# Patient Record
Sex: Male | Born: 1970 | Race: White | Hispanic: Yes | Marital: Married | Smoking: Never smoker
Health system: Southern US, Community
[De-identification: ages and names within clinical notes are randomized; demographics above are authoritative.]

---

## 2016-10-29 ENCOUNTER — Ambulatory Visit (INDEPENDENT_AMBULATORY_CARE_PROVIDER_SITE_OTHER): Payer: Medicaid Other | Admitting: Physician Assistant

## 2016-10-29 ENCOUNTER — Encounter (INDEPENDENT_AMBULATORY_CARE_PROVIDER_SITE_OTHER): Payer: Self-pay | Admitting: Physician Assistant

## 2016-10-29 VITALS — BP 123/77 | HR 75 | Temp 97.9°F | Ht 65.75 in | Wt 196.6 lb

## 2016-10-29 DIAGNOSIS — Z0000 Encounter for general adult medical examination without abnormal findings: Secondary | ICD-10-CM

## 2016-10-29 NOTE — Progress Notes (Signed)
  Subjective:  Patient ID: Eric Webb, male    DOB: February 24, 1971  Age: 46 y.o. MRN: [id number]  CC: Annual physical  HPI Eric Webb is a 46 y.o. male with no significant PMH presents for a full physical exam. States that he has been generally well. Only complaint isoccasional  right shoulder pain attributed to working out. There is a popping and grinding sensation upon abduction and flexion of the shoulder. Popping and grinding in itself is not painful but heavy lifting above the shoulder level is. Has not received previous treatment nor is he interested in treatment at this time due to how mild the pain is. No tingling/numbness, weakness, limited aROM, or vertebral pain. Denies any other symptoms.  Review of Systems  Constitutional: Negative for chills, fever and malaise/fatigue.  Eyes: Negative for blurred vision.  Respiratory: Negative for shortness of breath.   Cardiovascular: Negative for chest pain and palpitations.  Gastrointestinal: Negative for abdominal pain and nausea.  Genitourinary: Negative for dysuria and hematuria.  Musculoskeletal: Positive for joint pain. Negative for myalgias.  Skin: Negative for rash.  Neurological: Negative for tingling and headaches.  Psychiatric/Behavioral: Negative for depression. The patient is not nervous/anxious.     Objective:  BP 123/77   Pulse 75   Temp 97.9 F (36.6 C)   Ht 5' 5.75" (1.67 m)   Wt 196 lb 9.6 oz (89.2 kg)   SpO2 99%   BMI 31.98 kg/m   BP/Weight 10/29/2016  Systolic BP 123  Diastolic BP 77  Wt. (Lbs) 196.6  BMI 31.98      Physical Exam  Constitutional: He is oriented to person, place, and time.  Well developed,overweight, NAD, polite  HENT:  Head: Normocephalic and atraumatic.  Mouth/Throat: No oropharyngeal exudate.  Eyes: Conjunctivae and EOM are normal. Pupils are equal, round, and reactive to light. No scleral icterus.  Neck: Normal range of motion. Neck supple. No thyromegaly present.   Cardiovascular: Normal rate, regular rhythm and normal heart sounds.   Pulmonary/Chest: Effort normal and breath sounds normal.  Abdominal: Soft. Bowel sounds are normal. He exhibits no distension and no mass. There is no tenderness. There is no rebound and no guarding.  Genitourinary: Penis normal.  Genitourinary Comments: Testicles normal, prostate 25g, central fissure normal, no nodules, non tender.  Musculoskeletal: He exhibits no edema, tenderness or deformity.  Lymphadenopathy:    He has no cervical adenopathy.  Neurological: He is alert and oriented to person, place, and time. He has normal reflexes. No cranial nerve deficit. Coordination normal.  Skin: Skin is warm and dry. No rash noted. No erythema. No pallor.  Psychiatric: He has a normal mood and affect. His behavior is normal. Thought content normal.  Vitals reviewed.    Assessment & Plan:   1. Annual physical exam - CBC with Differential/Platelet - Comprehensive metabolic panel - PSA - Lipid panel     Follow-up: Return in about 1 year (around 10/29/2017).   Loletta Specter PA

## 2016-10-29 NOTE — Patient Instructions (Signed)
Anlisis de antgeno prosttico especfico (Prostate-Specific Antigen Test) POR QU ME DEBO REALIZAR ESTE ANLISIS? El anlisis de antgeno prosttico especfico (PSA) se realiza para Production assistant, radio cantidad de PSA que tiene Risk manager. El PSA es un tipo de protena que generalmente est presente en la prstata. Ciertas enfermedades pueden hacer que los niveles sanguneos de PSA aumenten, tales como:  Infeccin de la prstata (prostatitis).  Agrandamiento de la prstata (hipertrofia).  Cncer de prstata. Dado que los niveles de PSA aumentan mucho a causa del cncer de prstata, este anlisis se puede usar para confirmar un diagnstico de Firefighter. Tambin se puede usar para Sales executive tratamiento para Management consultant de prstata y para ver si el cncer de prstata vuelve a aparecer despus de que el tratamiento ha finalizado. Este [employer] tiene un ndice muy alto de [place] positivos falsos. Por lo tanto, ya no se recomienda la prueba de deteccin de rutina del PSA para todos los hombres. Este tipo de resultado es incorrecto porque indica que una enfermedad o un hallazgo est presente cuando en realidad no lo est. QU TIPO DE MUESTRA SE TOMA? Para esta prueba, se extrae Lauris Poag de [place]. Por lo general, para extraerla, se introduce una aguja en una vena o se pincha un dedo con una aguja pequea. CMO DEBO PREPARARME PARA ESTE ANLISIS? No se requiere preparacin para este anlisis. Sin embargo, Therapist, music otros factores que pueden afectar los resultados del [place] de PSA. Para obtener los resultados ms precisos:  Higher education careers adviser un examen rectal en el perodo de varias horas previas a la extraccin de sangre para este anlisis.  Evite cualquier procedimiento en la prstata en el perodo de las 6 semanas previas a este anlisis.  Evite eyacular dentro de las 24horas previas a este anlisis.  Infrmele al mdico si tuvo una infeccin urinaria reciente.  Infrmele al mdico si est  tomando medicamentos para ayudar a que le crezca el pelo, como finasterida.  Infrmele al mdico si ha estado expuesto a un medicamento llamado dietilestilbestrol. Infrmele al mdico si cualquiera de estos factores corresponde a su caso. Es posible que se le pida que reprograme el [place]. QU SON LOS VALORES DE REFERENCIA? Los valores de referencia se establecen despus de realizarle el anlisis a un grupo grande de Dealer. Pueden variar [employer], laboratorios y hospitales. Es su responsabilidad retirar el resultado del [place]. Consulte en el laboratorio o en el departamento en el que fue realizado el estudio cundo y cmo podr [employer].  Bajo: de 0 a 2,5ng/ml.  Levemente a moderadamente elevado: de 2,6 a 10,0ng/ml.  Moderadamente elevado: de 10,0 a 19,9ng/ml.  Muy elevado: 20ng/ml o ms. QU SIGNIFICAN LOS RESULTADOS? En la [employer] de los hombres con cncer de prstata, los resultados del [place] de PSA son mayores de 4ng/ml. Si el resultado del anlisis est por encima de [address], esto puede indicar un aumento del riesgo de cncer de prstata. Un aumento en los niveles de PSA tambin puede indicar otras enfermedades. Hable con el mdico [employer], las opciones de tratamiento y, si es necesario, la necesidad de Education officer, environmental ms [place]. Hable con el mdico si tiene [employer]. Esta informacin no tiene Theme park manager el consejo del mdico. Asegrese de hacerle al mdico cualquier pregunta que tenga. Document Released: 04/19/2013 Document Revised: 07/20/2014 Document Reviewed: 11/22/2013 Elsevier Interactive Patient Education  2017 [employer].

## 2016-11-09 ENCOUNTER — Other Ambulatory Visit (INDEPENDENT_AMBULATORY_CARE_PROVIDER_SITE_OTHER): Payer: Medicaid Other

## 2016-11-09 ENCOUNTER — Encounter (INDEPENDENT_AMBULATORY_CARE_PROVIDER_SITE_OTHER): Payer: Self-pay

## 2016-11-09 ENCOUNTER — Other Ambulatory Visit (INDEPENDENT_AMBULATORY_CARE_PROVIDER_SITE_OTHER): Payer: Self-pay | Admitting: Physician Assistant

## 2016-11-09 VITALS — BP 143/85 | HR 59 | Temp 98.1°F | Wt 197.2 lb

## 2016-11-09 DIAGNOSIS — Z114 Encounter for screening for human immunodeficiency virus [HIV]: Secondary | ICD-10-CM | POA: Diagnosis not present

## 2016-11-09 DIAGNOSIS — Z0000 Encounter for general adult medical examination without abnormal findings: Secondary | ICD-10-CM

## 2016-11-09 NOTE — Progress Notes (Signed)
Needs new orders for his annual physical labs.

## 2016-11-10 ENCOUNTER — Other Ambulatory Visit (INDEPENDENT_AMBULATORY_CARE_PROVIDER_SITE_OTHER): Payer: Self-pay | Admitting: Physician Assistant

## 2016-11-10 DIAGNOSIS — E785 Hyperlipidemia, unspecified: Secondary | ICD-10-CM

## 2016-11-10 DIAGNOSIS — R748 Abnormal levels of other serum enzymes: Secondary | ICD-10-CM

## 2016-11-10 LAB — CBC WITH DIFFERENTIAL/PLATELET
BASOS ABS: 0 10*3/uL (ref 0.0–0.2)
Basos: 0 %
EOS (ABSOLUTE): 0.2 10*3/uL (ref 0.0–0.4)
Eos: 2 %
Hematocrit: 42.1 % (ref 37.5–51.0)
Hemoglobin: 14.3 g/dL (ref 13.0–17.7)
IMMATURE GRANS (ABS): 0 10*3/uL (ref 0.0–0.1)
Immature Granulocytes: 0 %
LYMPHS: 45 %
Lymphocytes Absolute: 3.5 10*3/uL — ABNORMAL HIGH (ref 0.7–3.1)
MCH: 28.9 pg (ref 26.6–33.0)
MCHC: 34 g/dL (ref 31.5–35.7)
MCV: 85 fL (ref 79–97)
Monocytes Absolute: 0.5 10*3/uL (ref 0.1–0.9)
Monocytes: 6 %
NEUTROS PCT: 47 %
Neutrophils Absolute: 3.7 10*3/uL (ref 1.4–7.0)
PLATELETS: 198 10*3/uL (ref 150–379)
RBC: 4.94 x10E6/uL (ref 4.14–5.80)
RDW: 13.6 % (ref 12.3–15.4)
WBC: 7.9 10*3/uL (ref 3.4–10.8)

## 2016-11-10 LAB — COMPREHENSIVE METABOLIC PANEL
ALT: 77 IU/L — AB (ref 0–44)
AST: 36 IU/L (ref 0–40)
Albumin/Globulin Ratio: 1.7 (ref 1.2–2.2)
Albumin: 4.5 g/dL (ref 3.5–5.5)
Alkaline Phosphatase: 56 IU/L (ref 39–117)
BILIRUBIN TOTAL: 0.4 mg/dL (ref 0.0–1.2)
BUN/Creatinine Ratio: 15 (ref 9–20)
BUN: 14 mg/dL (ref 6–24)
CALCIUM: 9.5 mg/dL (ref 8.7–10.2)
CHLORIDE: 101 mmol/L (ref 96–106)
CO2: 23 mmol/L (ref 18–29)
Creatinine, Ser: 0.92 mg/dL (ref 0.76–1.27)
GFR, EST AFRICAN AMERICAN: 115 mL/min/{1.73_m2} (ref >59)
GFR, EST NON AFRICAN AMERICAN: 99 mL/min/{1.73_m2} (ref >59)
GLUCOSE: 101 mg/dL — AB (ref 65–99)
Globulin, Total: 2.7 g/dL (ref 1.5–4.5)
Potassium: 4.7 mmol/L (ref 3.5–5.2)
Sodium: 139 mmol/L (ref 134–144)
TOTAL PROTEIN: 7.2 g/dL (ref 6.0–8.5)

## 2016-11-10 LAB — LIPID PANEL
Chol/HDL Ratio: 5.3 ratio — ABNORMAL HIGH (ref 0.0–5.0)
Cholesterol, Total: 179 mg/dL (ref 100–199)
HDL: 34 mg/dL — AB (ref >39)
LDL Calculated: 112 mg/dL — ABNORMAL HIGH (ref 0–99)
Triglycerides: 164 mg/dL — ABNORMAL HIGH (ref 0–149)
VLDL CHOLESTEROL CAL: 33 mg/dL (ref 5–40)

## 2016-11-10 LAB — PSA: Prostate Specific Ag, Serum: 1.3 ng/mL (ref 0.0–4.0)

## 2016-11-10 LAB — HIV ANTIBODY (ROUTINE TESTING W REFLEX): HIV Screen 4th Generation wRfx: NONREACTIVE

## 2016-11-10 MED ORDER — SIMVASTATIN 20 MG PO TABS: 20.0000 mg | ORAL_TABLET | Freq: Every day | ORAL | 1 refills | Status: DC

## 2016-11-10 NOTE — Progress Notes (Signed)
Elevated liver enzymes

## 2017-01-22 ENCOUNTER — Other Ambulatory Visit (INDEPENDENT_AMBULATORY_CARE_PROVIDER_SITE_OTHER): Payer: Self-pay | Admitting: Physician Assistant

## 2017-01-22 DIAGNOSIS — G8929 Other chronic pain: Secondary | ICD-10-CM

## 2017-01-22 DIAGNOSIS — M25511 Pain in right shoulder: Principal | ICD-10-CM

## 2017-01-26 ENCOUNTER — Ambulatory Visit (HOSPITAL_COMMUNITY)
Admission: RE | Admit: 2017-01-26 | Discharge: 2017-01-26 | Disposition: A | Discharge status: Home / Self Care | Payer: Medicaid Other | Source: Ambulatory Visit | Attending: Physician Assistant | Admitting: Physician Assistant

## 2017-01-26 ENCOUNTER — Other Ambulatory Visit (INDEPENDENT_AMBULATORY_CARE_PROVIDER_SITE_OTHER): Payer: Self-pay | Admitting: Physician Assistant

## 2017-01-26 DIAGNOSIS — M25511 Pain in right shoulder: Secondary | ICD-10-CM | POA: Diagnosis present

## 2017-01-26 DIAGNOSIS — E785 Hyperlipidemia, unspecified: Secondary | ICD-10-CM

## 2017-01-26 DIAGNOSIS — R748 Abnormal levels of other serum enzymes: Secondary | ICD-10-CM | POA: Diagnosis not present

## 2017-01-26 DIAGNOSIS — G8929 Other chronic pain: Secondary | ICD-10-CM | POA: Insufficient documentation

## 2017-01-26 MED ORDER — SIMVASTATIN 20 MG PO TABS: 20.0000 mg | ORAL_TABLET | Freq: Every day | ORAL | 1 refills | Status: DC

## 2017-01-27 ENCOUNTER — Ambulatory Visit (INDEPENDENT_AMBULATORY_CARE_PROVIDER_SITE_OTHER): Payer: Medicaid Other | Admitting: Physician Assistant

## 2017-01-27 ENCOUNTER — Encounter (INDEPENDENT_AMBULATORY_CARE_PROVIDER_SITE_OTHER): Payer: Self-pay | Admitting: Physician Assistant

## 2017-01-27 VITALS — BP 127/82 | HR 86 | Temp 97.9°F | Wt 195.0 lb

## 2017-01-27 DIAGNOSIS — G8929 Other chronic pain: Secondary | ICD-10-CM

## 2017-01-27 DIAGNOSIS — M25511 Pain in right shoulder: Secondary | ICD-10-CM

## 2017-01-27 DIAGNOSIS — S4991XS Unspecified injury of right shoulder and upper arm, sequela: Secondary | ICD-10-CM

## 2017-01-27 MED ORDER — ACETAMINOPHEN-CODEINE #3 300-30 MG PO TABS: 1.0000 | ORAL_TABLET | ORAL | 0 refills | Status: AC | PRN

## 2017-01-27 MED ORDER — NAPROXEN 500 MG PO TBEC: 500.0000 mg | DELAYED_RELEASE_TABLET | Freq: Two times a day (BID) | ORAL | 1 refills | Status: DC

## 2017-01-27 NOTE — Patient Instructions (Signed)
Lesiones de Hydrographic surveyorla parte superior del labrum glenoideo con rehabilitacin (SLAP Lesions With Rehab) Las lesiones del labrum glenoideo (Superior Labrum Anterior to Posterior, SLAP) son lesiones en una parte del tejido conjuntivo (TEFL teachercartlago) de la articulacin del hombro. El extremo de Seychellesarriba del hueso de la parte superior del brazo (hmero) encaja en una cavidad del omplato y forma la articulacin del hombro. Alrededor del borde de esa cavidad, hay un borde firme de cartlago (labrum). El labrum le otorga profundidad a la cavidad y sostiene al hmero en su Environmental consultantlugar. Si determinada parte del labrum se desgarra o se desgasta, se produce una lesin conocida como SLAP. La lesin SLAP puede causar dolor, inestabilidad y debilidad en el hombro. Las lesiones SLAP son frecuentes en los atletas que practican deportes en los que deben realizar movimientos repetidos por encima del nivel del hombro. Las lesiones SLAP pueden incluir un desgarro en el cordn de tejido que une el msculo de adelante de la parte superior del brazo con el omplato (tendn proximal del bceps). CAUSAS Esta afeccin puede ser causada por lo siguiente:  Neomia DearUna lesin repentina (aguda), que puede deberse a lo siguiente: ? Una cada sobre el brazo extendido. ? Un movimiento de la articulacin del hombro que lo saca de su ubicacin normal (luxacin). ? Un golpe directo en el hombro.  Desgaste con el paso del tiempo, que puede deberse a la prctica de deportes o actividades en los que se realizan movimientos del brazo por encima del nivel del hombro. FACTORES DE RIESGO Los siguientes factores pueden hacer que usted sea ms propenso a sufrir una lesin SLAP:  Haberse luxado un hombro en el pasado.  Ser mayor de [id number].  Practicar determinados deportes, por ejemplo: ? Deportes que requieren Education officer, environmentalrealizar de forma repetida movimientos del brazo por encima del nivel del hombro, como en el bisbol o el vleibol. ? Deportes en los que se ejerce una  presin posterior en los brazos al tenerlos levantados por encima del hombro, como gimnasia o bsquetbol. ? Deportes de contacto.  Levantamiento de pesas. SNTOMAS El sntoma principal de esta afeccin es Chief Technology Officerel dolor en el hombro que empeora al levantar un objeto pesado o al levantar el brazo por encima del nivel hombro. Otros signos y sntomas pueden incluir los siguientes:  Sensacin de que el hombro est trabado o bloqueado, o que hace un chasquido o chirrido.  Prdida de la fuerza.  Rigidez y amplitud de movimientos limitada.  Prdida de la fuerza de lanzamiento ("brazo muerto"). DIAGNSTICO Esta afeccin se puede diagnosticar en funcin de lo siguiente:  Sus sntomas.  Sus antecedentes mdicos.  Un examen fsico.  Pruebas de diagnstico por imgenes, como resonancias magnticas (RM). TRATAMIENTO El tratamiento de esta afeccin puede incluir lo siguiente:  Radio producerMantener el hombro en reposo, evitando las actividades que causen dolor en el hombro.  Tomar antiinflamatorios no esteroides Murriel Hopper(AINE) para ayudar a Glass blower/designerreducir el dolor y la hinchazn.  Hacer fisioterapia para mejorar la fuerza y la amplitud de movimientos.  Someterse a [place]una ciruga. Puede realizarse si otros tratamientos no son eficaces. La Kandis Banciruga consiste en lo siguiente: ? Extraer las partes desgastadas del labrum. ? Reparar los desgarros. ? Volver a unir el labrum. ? Reparar el tendn del bceps. INSTRUCCIONES PARA EL CUIDADO EN EL HOGAR Control del dolor, la rigidez y la hinchazn  Si se lo indican, aplique hielo sobre la zona de la lesin. ? Ponga el hielo en una bolsa plstica. ? Coloque una FirstEnergy Corptoalla entre la piel y la bolsa de  hielo. ? Coloque el hielo durante , 2 a 3veces por da. Conducir  No conduzca ni opere maquinaria pesada mientras toma analgsicos recetados.  Pregntele al mdico cundo es seguro volver a Science writer. Actividad  Retome sus actividades normales como se lo haya indicado el mdico.  Pregntele al mdico qu actividades son seguras para usted.  Haga ejercicios como se lo haya indicado el mdico. Instrucciones generales  No consuma ningn producto que contenga tabaco, lo que incluye cigarrillos, tabaco de Theatre manager y Administrator, Civil Service. El tabaco puede retrasar el proceso de curacin. Si necesita ayuda para dejar de fumar, consulte al [employer].  [employer] de venta libre y los recetados solamente como se lo haya indicado el mdico.  Oceanographer a todas las visitas de control como se lo haya indicado el mdico. Esto es importante. PREVENCIN  Tome medidas de seguridad y sea responsable al hacer [place], para evitar las cadas.  Mantenga un buen estado fsico, lo que incluye la flexibilidad y Primary school teacher. SOLICITE ATENCIN MDICA SI:  Los sntomas no mejoran despus de de [place].  Los sntomas empeoran en vez de Scientist, clinical (histocompatibility and immunogenetics). Esta informacin no tiene Theme park manager el consejo del mdico. Asegrese de hacerle al mdico cualquier pregunta que tenga. Document Released: 04/15/2006 Document Revised: 11/13/2014 Document Reviewed: 06/01/2015 Elsevier Interactive Patient Education  [employer].

## 2017-01-27 NOTE — Progress Notes (Signed)
Subjective:  Patient ID: Eric Webb, male    DOB: [birth year]  Age: 46 y.o. MRN: [id number]  CC: shoulder  HPI Eric OsgoodJavier Webb is a 46 y.o. male with a PMH of fatty liver and HLD presents with right shoulder pain since 2 years ago. Pain is felt at the glenohumeral joint and is associated with "cracking and popping". Pain is present only with flexion and abduction past 90 degrees. Also states there is mild pain when laying on right side at night. Otherwise, he is able to lift moderately heavy items below shoulder level. Has full ROM. Does not endorse tingling, numbness, weakness, paralysis, edema, erythema, ecchymosis, deformity, neck pain, back pain, or history of trauma.     Outpatient Medications Prior to Visit  Medication Sig Dispense Refill  . simvastatin (ZOCOR) 20 MG tablet Take 1 tablet (20 mg total) by mouth at bedtime. 90 tablet 1   No facility-administered medications prior to visit.      ROS Review of Systems  Constitutional: Negative for chills, fever and malaise/fatigue.  Eyes: Negative for blurred vision.  Respiratory: Negative for shortness of breath.   Cardiovascular: Negative for chest pain and palpitations.  Gastrointestinal: Negative for abdominal pain and nausea.  Genitourinary: Negative for dysuria and hematuria.  Musculoskeletal: Positive for joint pain. Negative for myalgias.  Skin: Negative for rash.  Neurological: Negative for tingling and headaches.  Psychiatric/Behavioral: Negative for depression. The patient is not nervous/anxious.     Objective:  BP 127/82 (BP Location: Left Arm, Patient Position: Sitting, Cuff Size: Normal)   Pulse 86   Temp 97.9 F (36.6 C) (Oral)   Wt 195 lb (88.5 kg)   SpO2 99%   BMI 31.72 kg/m   BP/Weight 01/27/2017 11/09/2016 10/29/2016  Systolic BP 127 143 123  Diastolic BP 82 85 77  Wt. (Lbs) 195 197.2 196.6  BMI 31.72 32.07 31.98      Physical Exam  Constitutional: He is oriented to person, place, and time.   Well developed, overweight, NAD, polite  HENT:  Head: Normocephalic and atraumatic.  Eyes: No scleral icterus.  Neck: Normal range of motion.  Musculoskeletal:  Crank test and Apprehension test positive. Negative Napolean's, Hornblowers, Cross body, Neer's, Hawkin's, AC shear, and internal rotation/external rotation resistance testing. Crepitus of the right shoulder. Full aROM and pROM of right shoulder.  Neurological: He is alert and oriented to person, place, and time. No cranial nerve deficit. Coordination normal.  Right hand grip strength 5/5  Skin: Skin is warm and dry.  Psychiatric: He has a normal mood and affect. His behavior is normal. Thought content normal.  Vitals reviewed.    Assessment & Plan:   1. Chronic right shoulder pain - Begin acetaminophen-codeine (TYLENOL #3) 300-30 MG tablet; Take 1 tablet by mouth every 4 (four) hours as needed for moderate pain.  Dispense: 42 tablet; Refill: 0 - Begin naproxen (EC NAPROSYN) 500 MG EC tablet; Take 1 tablet (500 mg total) by mouth 2 (two) times daily with a meal.  Dispense: 60 tablet; Refill: 1 - MR Shoulder Right Wo Contrast; Future  2. Injury of glenoid labrum, right, sequela - Begin acetaminophen-codeine (TYLENOL #3) 300-30 MG tablet; Take 1 tablet by mouth every 4 (four) hours as needed for moderate pain.  Dispense: 42 tablet; Refill: 0 - Begin naproxen (EC NAPROSYN) 500 MG EC tablet; Take 1 tablet (500 mg total) by mouth 2 (two) times daily with a meal.  Dispense: 60 tablet; Refill: 1 - MR Shoulder Right Wo Contrast; Future  Meds ordered this encounter  Medications  . acetaminophen-codeine (TYLENOL #3) 300-30 MG tablet    Sig: Take 1 tablet by mouth every 4 (four) hours as needed for moderate pain.    Dispense:  42 tablet    Refill:  0    Order Specific Question:   Supervising Provider    Answer:   Quentin Angst [id number]  . naproxen (EC NAPROSYN) 500 MG EC tablet    Sig: Take 1 tablet (500 mg total) by  mouth 2 (two) times daily with a meal.    Dispense:  60 tablet    Refill:  1    Order Specific Question:   Supervising Provider    Answer:   Quentin Angst [id number]    Follow-up: Return in about 4 weeks (around 02/24/2017).   Loletta Specter PA

## 2020-03-28 ENCOUNTER — Ambulatory Visit (INDEPENDENT_AMBULATORY_CARE_PROVIDER_SITE_OTHER): Payer: BC Managed Care – PPO

## 2020-03-28 ENCOUNTER — Ambulatory Visit
Admission: EM | Admit: 2020-03-28 | Discharge: 2020-03-28 | Disposition: A | Discharge status: Home / Self Care | Payer: BC Managed Care – PPO | Attending: Emergency Medicine | Admitting: Emergency Medicine

## 2020-03-28 DIAGNOSIS — U071 COVID-19: Secondary | ICD-10-CM | POA: Diagnosis not present

## 2020-03-28 MED ORDER — ACETAMINOPHEN 325 MG PO TABS
975.0000 mg | ORAL_TABLET | Freq: Once | ORAL | Status: AC
  Administered 2020-03-28: 16:00:00 975 mg via ORAL

## 2020-03-28 MED ORDER — BENZONATATE 100 MG PO CAPS: 100.0000 mg | ORAL_CAPSULE | Freq: Three times a day (TID) | ORAL | 0 refills | Status: AC

## 2020-03-28 MED ORDER — AZITHROMYCIN 250 MG PO TABS: 250.0000 mg | ORAL_TABLET | Freq: Every day | ORAL | 0 refills | Status: AC

## 2020-03-28 NOTE — ED Provider Notes (Signed)
[facility]    CSN: [id number] Arrival date & time: 03/28/20  1519      History   Chief Complaint Chief Complaint  Patient presents with  . covid + - fever    HPI Eric Webb is a 49 y.o. male  Presenting for chest x-ray.  States he is known Covid positive: Underwent testing 9/10 due to exposure.  Developed symptoms last Friday.  Primarily concerned about cough which is nonproductive, not associated with chest pain, palpitations, shortness of breath, lower leg swelling, or lightheadedness.  Does have fevers that are intermittent, alleviated with Tylenol.  Has had intermittent abdominal pain without exacerbating or alleviating factors.  Denies vomiting or diarrhea.  Taken OTC medications with moderate relief.  History reviewed. No pertinent past medical history.  There are no problems to display for this patient.   History reviewed. No pertinent surgical history.     Home Medications    Prior to Admission medications   Medication Sig Start Date End Date Taking? Authorizing Provider  azithromycin (ZITHROMAX) 250 MG tablet Take 1 tablet (250 mg total) by mouth daily. Take first 2 tablets together, then 1 every day until finished. 03/28/20   Hall-Potvin, [place], PA-C  benzonatate (TESSALON) 100 MG capsule Take 1 capsule (100 mg total) by mouth every 8 (eight) hours. 03/28/20   Hall-Potvin, [place], PA-C    Family History History reviewed. No pertinent family history.  Social History Social History   Tobacco Use  . Smoking status: Never Smoker  . Smokeless tobacco: Never Used  Substance Use Topics  . Alcohol use: Not Currently  . Drug use: Not Currently     Allergies   Patient has no known allergies.   Review of Systems As per HPI   Physical Exam Triage Vital Signs ED Triage Vitals  Enc Vitals Group     BP      Pulse      Resp      Temp      Temp src      SpO2      Weight      Height      Head Circumference      Peak Flow       Pain Score      Pain Loc      Pain Edu?      Excl. in GC?    No data found.  Updated Vital Signs BP 133/75 (BP Location: Left Arm)   Pulse 90   Temp (!) 102.6 F (39.2 C) (Oral)   Resp 18   SpO2 96%   Visual Acuity Right Eye Distance:   Left Eye Distance:   Bilateral Distance:    Right Eye Near:   Left Eye Near:    Bilateral Near:     Physical Exam Constitutional:      General: He is not in acute distress.    Appearance: He is not toxic-appearing or diaphoretic.  HENT:     Head: Normocephalic and atraumatic.     Mouth/Throat:     Mouth: Mucous membranes are moist.     Pharynx: Oropharynx is clear.  Eyes:     General: No scleral icterus.    Conjunctiva/sclera: Conjunctivae normal.     Pupils: Pupils are equal, round, and reactive to light.  Neck:     Comments: Trachea midline, negative JVD Cardiovascular:     Rate and Rhythm: Normal rate and regular rhythm.  Pulmonary:     Effort: Pulmonary effort is normal.  No respiratory distress.     Breath sounds: No wheezing or rhonchi.  Musculoskeletal:     Cervical back: Neck supple. No tenderness.  Lymphadenopathy:     Cervical: No cervical adenopathy.  Skin:    Capillary Refill: Capillary refill takes less than 2 seconds.     Coloration: Skin is not jaundiced or pale.     Findings: No rash.  Neurological:     Mental Status: He is alert and oriented to person, place, and time.      UC Treatments / Results  Labs (all labs ordered are listed, but only abnormal results are displayed) Labs Reviewed - No data to display  EKG   Radiology DG Chest 2 View  Result Date: 03/28/2020 CLINICAL DATA:  COVID positive EXAM: CHEST - 2 VIEW COMPARISON:  None. FINDINGS: The heart size and mediastinal contours are within normal limits. Mildly hazy airspace opacity seen within the left mid lung. The right lung is clear. No pleural effusion is seen. No acute osseous abnormality. IMPRESSION: Mildly hazy airspace opacity seen  within right mid lung, which could be due to atelectasis and/or infectious etiology. A Electronically Signed   By: Jonna Clark M.D.   On: 03/28/2020 16:15    Procedures Procedures (including critical care time)  Medications Ordered in UC Medications  acetaminophen (TYLENOL) tablet 975 mg (975 mg Oral Given 03/28/20 1619)    Initial Impression / Assessment and Plan / UC Course  I have reviewed the triage vital signs and the nursing notes.  Pertinent labs & imaging results that were available during my care of the patient were reviewed by me and considered in my medical decision making (see chart for details).     Febrile, nontoxic, and hemodynamically stable with good oxygen saturations.  No shortness of breath, chest pain.  Chest x-ray with mildly hazy airspace opacity seen in right midlung: Could be due to atelectasis and/or infectious etiology.  Given weeklong course of cough with known Covid infection, persistent fevers, will start Z-Pak..  Return precautions discussed, pt verbalized understanding and is agreeable to plan. Final Clinical Impressions(s) / UC Diagnoses   Final diagnoses:  COVID-19 virus infection     Discharge Instructions     Tessalon for cough. Start flonase, atrovent nasal spray for nasal congestion/drainage. You can use over the counter nasal saline rinse such as neti pot for nasal congestion. Keep hydrated, your urine should be clear to pale yellow in color. Tylenol/motrin for fever and pain. Monitor for any worsening of symptoms, chest pain, shortness of breath, wheezing, swelling of the throat, go to the emergency department for further evaluation needed.     ED Prescriptions    Medication Sig Dispense Auth. Provider   benzonatate (TESSALON) 100 MG capsule Take 1 capsule (100 mg total) by mouth every 8 (eight) hours. 21 capsule Hall-Potvin, [place], PA-C   azithromycin (ZITHROMAX) 250 MG tablet Take 1 tablet (250 mg total) by mouth daily. Take first 2  tablets together, then 1 every day until finished. 6 tablet Hall-Potvin, [place], PA-C     PDMP not reviewed this encounter.   Hall-Potvin, [place], [place] 03/28/20 1643

## 2020-03-28 NOTE — Discharge Instructions (Addendum)

## 2020-03-28 NOTE — ED Triage Notes (Signed)
Pt states tested covid positive on Friday and developed sx's afterwards. C/o cough x1wk and fever past few days.

## 2020-03-29 ENCOUNTER — Encounter: Payer: Self-pay | Admitting: Oncology

## 2020-03-29 ENCOUNTER — Other Ambulatory Visit: Payer: Self-pay | Admitting: Oncology

## 2020-03-29 DIAGNOSIS — U071 COVID-19: Secondary | ICD-10-CM

## 2020-03-29 NOTE — Progress Notes (Signed)
I connected by phone with  Eric Webb  to discuss the potential use of an new treatment for mild to moderate COVID-19 viral infection in non-hospitalized patients.   This patient is a age/sex that meets the FDA criteria for Emergency Use Authorization of casirivimab\imdevimab.  Has a (+) direct SARS-CoV-2 viral test result 1. Has mild or moderate COVID-19  2. Is ? 49 years of age and weighs ? 40 kg 3. Is NOT hospitalized due to COVID-19 4. Is NOT requiring oxygen therapy or requiring an increase in baseline oxygen flow rate due to COVID-19 5. Is within 10 days of symptom onset 6. Has at least one of the high risk factor(s) for progression to severe COVID-19 and/or hospitalization as defined in EUA. ? Specific high risk criteria :No past medical history on file. ? HIGH RISK-Obesity    Symptom onset 03/21/2020   I have spoken and communicated the following to the patient or parent/caregiver:   1. FDA has authorized the emergency use of casirivimab\imdevimab for the treatment of mild to moderate COVID-19 in adults and pediatric patients with positive results of direct SARS-CoV-2 viral testing who are 31 years of age and older weighing at least 40 kg, and who are at high risk for progressing to severe COVID-19 and/or hospitalization.   2. The significant known and potential risks and benefits of casirivimab\imdevimab, and the extent to which such potential risks and benefits are unknown.   3. Information on available alternative treatments and the risks and benefits of those alternatives, including clinical trials.   4. Patients treated with casirivimab\imdevimab should continue to self-isolate and use infection control measures (e.g., wear mask, isolate, social distance, avoid sharing personal items, clean and disinfect "high touch" surfaces, and frequent handwashing) according to CDC guidelines.    5. The patient or parent/caregiver has the option to accept or refuse casirivimab\imdevimab .    After reviewing this information with the patient, The patient agreed to proceed with receiving casirivimab\imdevimab infusion and will be provided a copy of the Fact sheet prior to receiving the infusion.Mignon Pine, AGNP-C [phone] (Infusion Center Hotline)

## 2020-03-30 ENCOUNTER — Ambulatory Visit (HOSPITAL_COMMUNITY)
Admission: RE | Admit: 2020-03-30 | Discharge: 2020-03-30 | Disposition: A | Discharge status: Home / Self Care | Payer: BC Managed Care – PPO | Source: Ambulatory Visit | Attending: Pulmonary Disease | Admitting: Pulmonary Disease

## 2020-03-30 DIAGNOSIS — U071 COVID-19: Secondary | ICD-10-CM | POA: Insufficient documentation

## 2020-03-30 MED ORDER — ALBUTEROL SULFATE HFA 108 (90 BASE) MCG/ACT IN AERS: 2.0000 | INHALATION_SPRAY | Freq: Once | RESPIRATORY_TRACT | Status: DC | PRN

## 2020-03-30 MED ORDER — FAMOTIDINE IN NACL 20-0.9 MG/50ML-% IV SOLN: 20.0000 mg | Freq: Once | INTRAVENOUS | Status: DC | PRN

## 2020-03-30 MED ORDER — SODIUM CHLORIDE 0.9 % IV SOLN
1200.0000 mg | Freq: Once | INTRAVENOUS | Status: AC
  Administered 2020-03-30: 1200 mg via INTRAVENOUS

## 2020-03-30 MED ORDER — METHYLPREDNISOLONE SODIUM SUCC 125 MG IJ SOLR: 125.0000 mg | Freq: Once | INTRAMUSCULAR | Status: DC | PRN

## 2020-03-30 MED ORDER — DIPHENHYDRAMINE HCL 50 MG/ML IJ SOLN: 50.0000 mg | Freq: Once | INTRAMUSCULAR | Status: DC | PRN

## 2020-03-30 MED ORDER — EPINEPHRINE 0.3 MG/0.3ML IJ SOAJ: 0.3000 mg | Freq: Once | INTRAMUSCULAR | Status: DC | PRN

## 2020-03-30 MED ORDER — SODIUM CHLORIDE 0.9 % IV SOLN: INTRAVENOUS | Status: DC | PRN

## 2020-03-30 NOTE — Discharge Instructions (Signed)
COVID-19: Cmo protegerse y proteger a los dems COVID-19: How to Protect Yourself and Others Sepa cmo se propaga  Actualmente, no existe ninguna vacuna para prevenir la enfermedad por coronavirus 2019 (COVID-19).  La mejor forma de prevenir la enfermedad es evitar exponerse a este virus.  Se cree que el virus se transmite principalmente de una persona a otra. ? Entre las personas que estn en contacto directo entre s (a una distancia inferior a 6 pies [1.80m]). ? A travs de las gotitas respiratorias producidas cuando una persona infectada tose, estornuda o habla. ? Estas gotitas pueden caer en la boca o en la nariz de las personas que estn cerca o pueden ser inhaladas hacia los pulmones. ? El COVID-19 puede ser transmitido por personas que no presentan sntomas. Lo que todos deben hacer Lmpiese las manos con frecuencia  Lvese las manos con frecuencia con agua y jabn durante al menos 20 segundos, especialmente despus de haber estado en un lugar pblico o despus de sonarse la nariz, toser o estornudar.  Si no dispone de agua y jabn, use un desinfectante de manos que contenga al menos un 60% de alcohol. Cubra todas las superficies de las manos y frtelas hasta que se sientan secas.  No se toque los ojos, la nariz y la boca sin antes lavarse las manos. Evite el contacto cercano  Limite el contacto directo con otras personas tanto como sea posible.  Evite el contacto cercano con personas que estn enfermas.  Establezca distancia entre usted y otras personas. ? Recuerde que algunas personas que no tienen sntomas pueden transmitir el virus. ? Esto es especialmente importante para las personas que tienen ms riesgo de enfermarse.www.cdc.gov/coronavirus/2019-ncov/need-extra-precautions/people-at-higher-risk.html Cbrase la boca y la nariz con una mascarilla cuando est cerca de otras personas  Puede transmitir el COVID-19 a otras personas aunque no se sienta enfermo.  Todas las  personas deben usar mascarilla en lugares pblicos y cuando estn con otras que no vivan en su casa, especialmente cuando el distanciamiento social sea difcil de mantener. ? Las mascarillas no deben colocarse a nios menores de 2 aos de edad, a las personas que tienen problemas respiratorios o que estn inconscientes, incapacitadas o que por algn motivo no puedan quitarse la mascarilla sin ayuda.  El propsito de la mascarilla es proteger a otras personas en caso de que usted est infectado.  NO utilice las mascarillas destinadas a los trabajadores de la salud.  Contine manteniendo una distancia aproximada de 6 pies (1.80m) entre usted y otras personas. La mascarilla no reemplaza el distanciamiento social. Cbrase al toser y estornudar  Al toser o al estornudar, siempre cbrase la boca y la nariz con un pauelo descartable o use el interior del codo.  Deseche los pauelos descartables usados en la basura.  Inmediatamente, lvese las manos con agua y jabn durante al menos 20segundos. Si no dispone de agua y jabn, lmpiese las manos con un desinfectante de manos que contenga al menos un 60% de alcohol. Limpie y desinfecte  Limpie Y desinfecte las superficies que se tocan con frecuencia todos los das. Esto incluye mesas, picaportes, interruptores de luz, encimeras, mangos, escritorios, telfonos, teclados, inodoros, grifos y lavabos. www.cdc.gov/coronavirus/2019-ncov/prevent-getting-sick/disinfecting-your-home.html  Si las superficies estn sucias, lmpielas: Use detergente o jabn y agua antes de la desinfeccin.  Luego, use un desinfectante domstico. Puede consultar una lista de los desinfectantes domsticos registrados en la Environmental Protection Agency (EPA) (Agencia de [employer]in Ambient[employer]) aqu. cdc.gov/coronavirus 03/15/2019 Esta informacin no tiene como fin reemplazar el consejo del mdico.   Asegrese de hacerle al mdico cualquier pregunta que tenga. Document Revised:  03/29/2019 Document Reviewed: 01/20/2019 Elsevier Patient Education  2020 [employer] What types of side effects do monoclonal antibody drugs cause?  Common side effects  In general, the more common side effects caused by monoclonal antibody drugs include: . Allergic reactions, such as hives or itching . Flu-like signs and symptoms, including chills, fatigue, fever, and muscle aches and pains . Nausea, vomiting . Diarrhea . Skin rashes . Low blood pressure   The CDC is recommending patients who receive monoclonal antibody treatments wait at least 90 days before being vaccinated.  Currently, there are no data on the safety and efficacy of mRNA COVID-19 vaccines in persons who received monoclonal antibodies or convalescent plasma as part of COVID-19 treatment. Based on the estimated half-life of such therapies as well as evidence suggesting that reinfection is uncommon in the 90 days after initial infection, vaccination should be deferred for at least 90 days, as a precautionary measure until additional information becomes available, to avoid interference of the antibody treatment with vaccine-induced immune responses. 

## 2020-03-30 NOTE — Progress Notes (Signed)
  Diagnosis: COVID-19  Physician: Patrick Wright, MD  Procedure: [facility] Med: casirivimab\imdevimab infusion - Provided patient with casirivimab\imdevimab fact sheet for patients, parents and caregivers prior to infusion.  Complications: No immediate complications noted.  Discharge: Discharged home   Eric Webb Eric Webb 03/30/2020  

## 2021-05-24 IMAGING — DX DG CHEST 2V
2 series · 2 of 2 positions shown · non-contrast
Comparison: None.

CLINICAL DATA: COVID positive

EXAM:
CHEST - 2 VIEW

[chest pa]
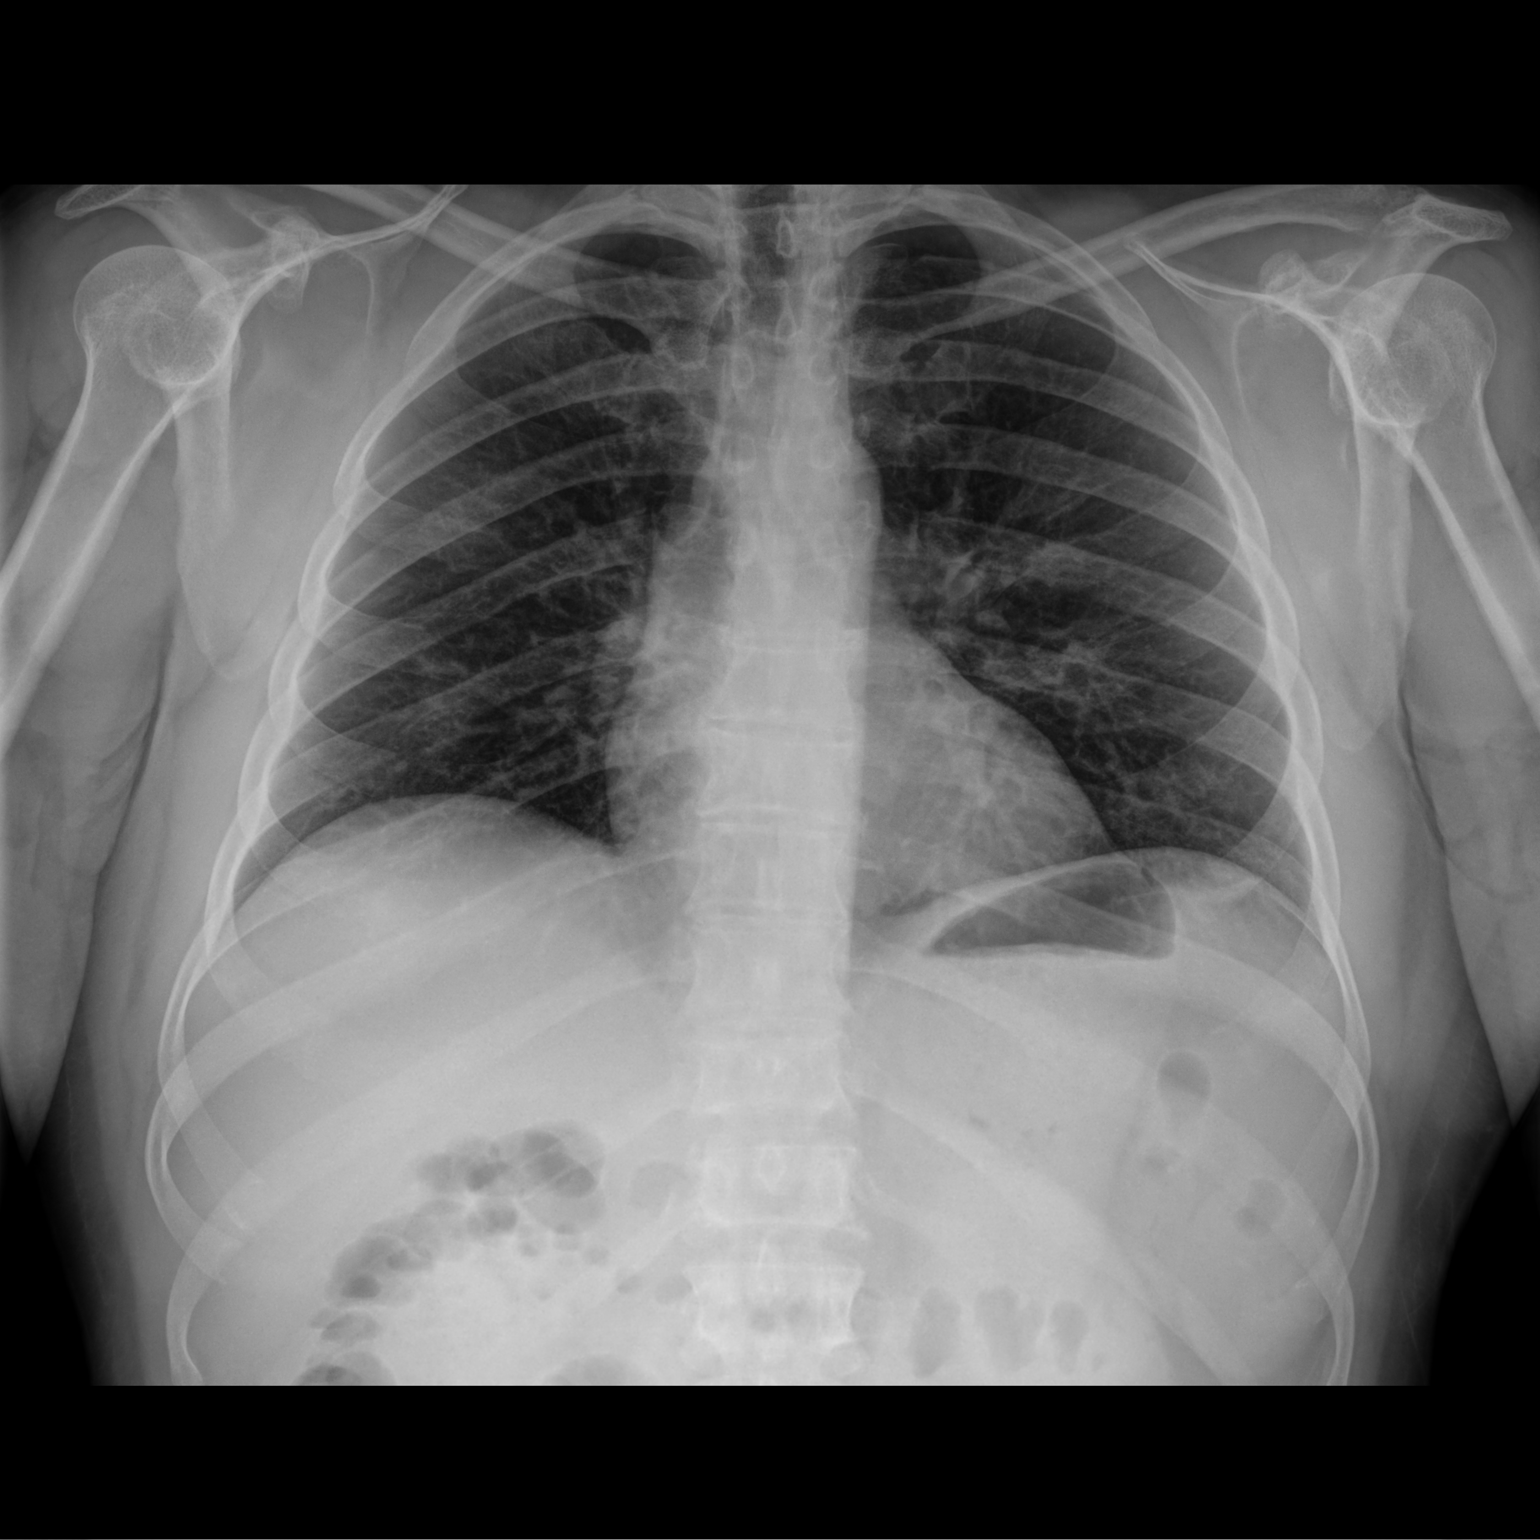

[chest lat]
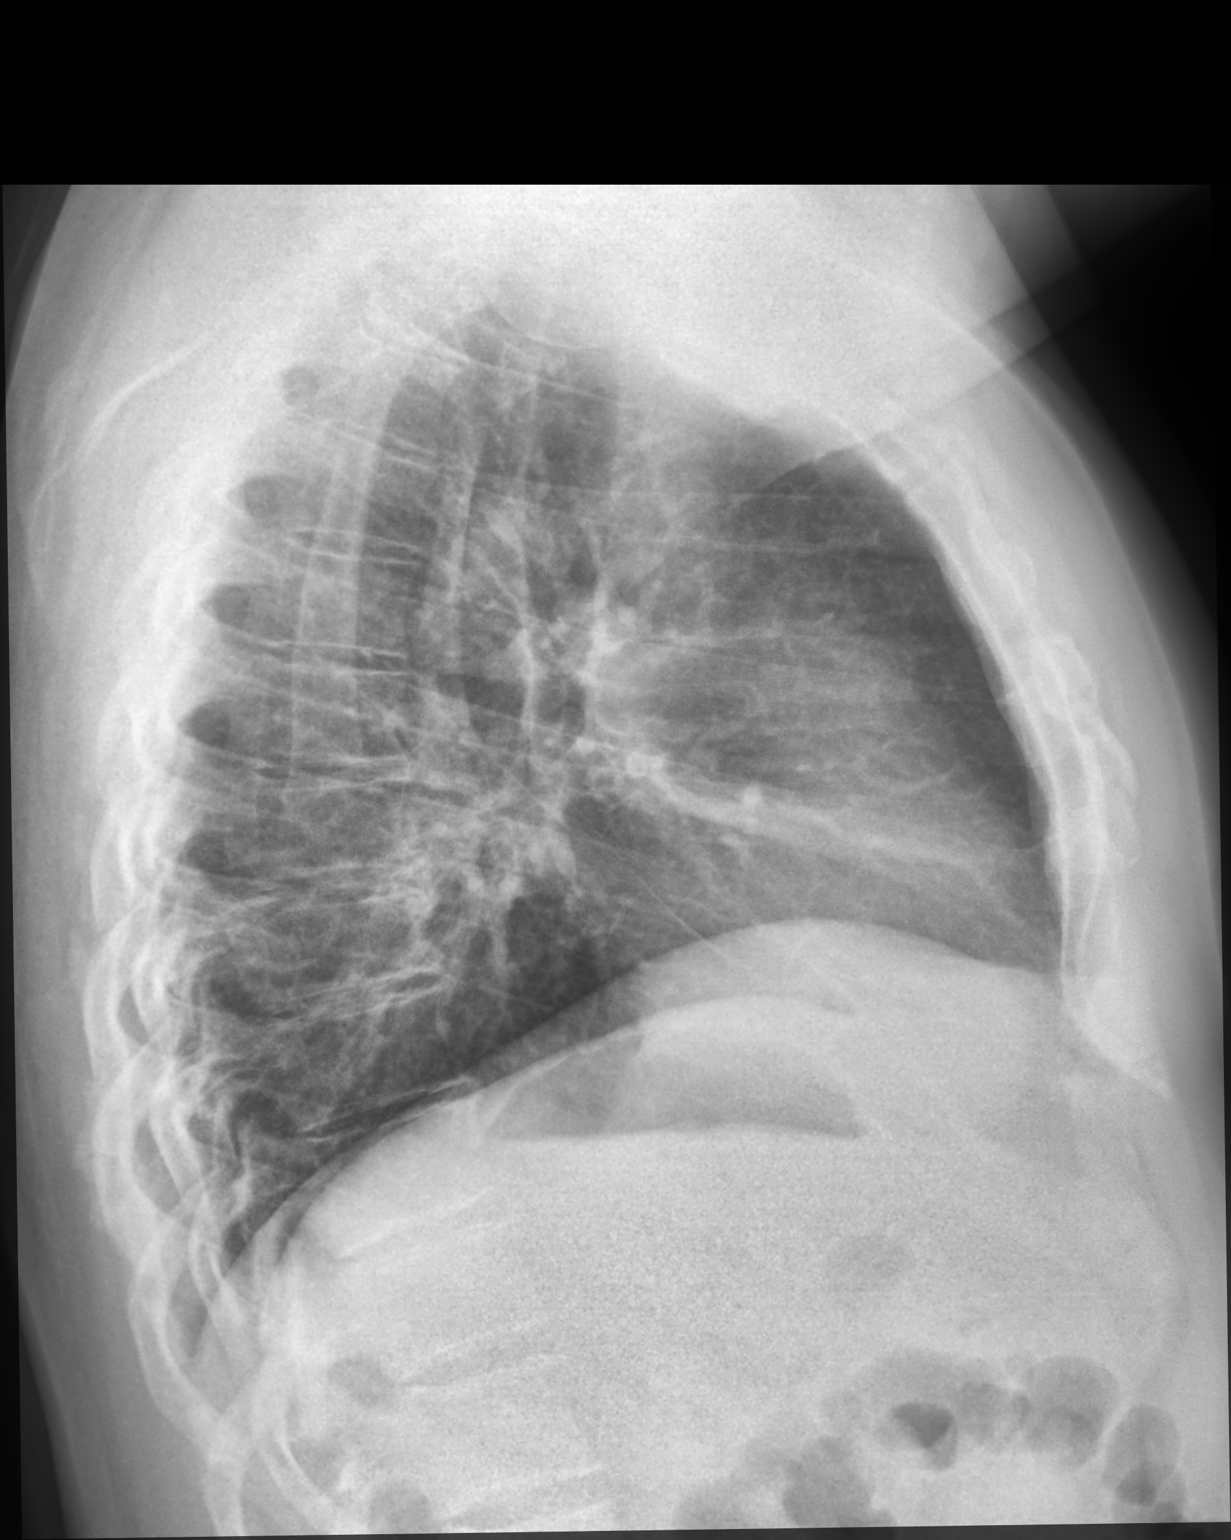

[2 of 2 positions shown; findings below may reference images not displayed]

FINDINGS: The heart size and mediastinal contours are within normal limits.
Mildly hazy airspace opacity seen within the left mid lung. The
right lung is clear. No pleural effusion is seen. No acute osseous
abnormality.
IMPRESSION: Mildly hazy airspace opacity seen within right mid lung, which could
be due to atelectasis and/or infectious etiology. A
